# Patient Record
Sex: Female | Born: 1995
Health system: Southern US, Community
[De-identification: ages and names within clinical notes are randomized; demographics above are authoritative.]

## PROBLEM LIST (undated history)

## (undated) DIAGNOSIS — E119 Type 2 diabetes mellitus without complications: Secondary | ICD-10-CM

## (undated) DIAGNOSIS — J45909 Unspecified asthma, uncomplicated: Secondary | ICD-10-CM

## (undated) DIAGNOSIS — M1990 Unspecified osteoarthritis, unspecified site: Secondary | ICD-10-CM

## (undated) HISTORY — PX: WRIST SURGERY: SHX841

## (undated) HISTORY — DX: Unspecified osteoarthritis, unspecified site: M19.90

---
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
## 2014-02-05 ENCOUNTER — Encounter (HOSPITAL_COMMUNITY): Payer: Self-pay | Admitting: Obstetrics and Gynecology

## 2014-04-12 ENCOUNTER — Ambulatory Visit: Payer: Medicaid Other | Admitting: Family

## 2014-05-10 ENCOUNTER — Ambulatory Visit: Payer: Medicaid Other | Admitting: Family Medicine

## 2014-06-13 ENCOUNTER — Ambulatory Visit: Payer: Medicaid Other | Admitting: Obstetrics & Gynecology

## 2015-06-28 ENCOUNTER — Encounter (HOSPITAL_BASED_OUTPATIENT_CLINIC_OR_DEPARTMENT_OTHER): Payer: Self-pay | Admitting: *Deleted

## 2015-06-28 ENCOUNTER — Emergency Department (HOSPITAL_BASED_OUTPATIENT_CLINIC_OR_DEPARTMENT_OTHER)
Admission: EM | Admit: 2015-06-28 | Discharge: 2015-06-28 | Disposition: A | Discharge status: Home / Self Care | Payer: Medicaid Other | Attending: Emergency Medicine | Admitting: Emergency Medicine

## 2015-06-28 DIAGNOSIS — Z8739 Personal history of other diseases of the musculoskeletal system and connective tissue: Secondary | ICD-10-CM | POA: Diagnosis not present

## 2015-06-28 DIAGNOSIS — R630 Anorexia: Secondary | ICD-10-CM | POA: Diagnosis not present

## 2015-06-28 DIAGNOSIS — E119 Type 2 diabetes mellitus without complications: Secondary | ICD-10-CM | POA: Insufficient documentation

## 2015-06-28 DIAGNOSIS — R109 Unspecified abdominal pain: Secondary | ICD-10-CM | POA: Insufficient documentation

## 2015-06-28 DIAGNOSIS — J3489 Other specified disorders of nose and nasal sinuses: Secondary | ICD-10-CM | POA: Diagnosis not present

## 2015-06-28 DIAGNOSIS — M545 Low back pain: Secondary | ICD-10-CM | POA: Diagnosis not present

## 2015-06-28 DIAGNOSIS — R000 Tachycardia, unspecified: Secondary | ICD-10-CM | POA: Insufficient documentation

## 2015-06-28 DIAGNOSIS — J45909 Unspecified asthma, uncomplicated: Secondary | ICD-10-CM | POA: Diagnosis not present

## 2015-06-28 DIAGNOSIS — R509 Fever, unspecified: Secondary | ICD-10-CM | POA: Insufficient documentation

## 2015-06-28 DIAGNOSIS — R51 Headache: Secondary | ICD-10-CM | POA: Insufficient documentation

## 2015-06-28 DIAGNOSIS — R0989 Other specified symptoms and signs involving the circulatory and respiratory systems: Secondary | ICD-10-CM | POA: Insufficient documentation

## 2015-06-28 DIAGNOSIS — R0981 Nasal congestion: Secondary | ICD-10-CM | POA: Insufficient documentation

## 2015-06-28 DIAGNOSIS — R6889 Other general symptoms and signs: Secondary | ICD-10-CM

## 2015-06-28 HISTORY — DX: Type 2 diabetes mellitus without complications: E11.9

## 2015-06-28 LAB — URINALYSIS, ROUTINE W REFLEX MICROSCOPIC
BILIRUBIN URINE: NEGATIVE
Glucose, UA: NEGATIVE mg/dL
Hgb urine dipstick: NEGATIVE
KETONES UR: 15 mg/dL — AB
Leukocytes, UA: NEGATIVE
NITRITE: NEGATIVE
PH: 6 (ref 5.0–8.0)
Protein, ur: NEGATIVE mg/dL
Specific Gravity, Urine: 1.031 — ABNORMAL HIGH (ref 1.005–1.030)

## 2015-06-28 MED ORDER — GUAIFENESIN 100 MG/5ML PO LIQD: 100.0000 mg | ORAL | Status: AC | PRN

## 2015-06-28 MED ORDER — IBUPROFEN 800 MG PO TABS
800.0000 mg | ORAL_TABLET | Freq: Once | ORAL | Status: AC
  Administered 2015-06-28: 800 mg via ORAL
  Filled 2015-06-28: qty 1

## 2015-06-28 MED ORDER — ACETAMINOPHEN 325 MG PO TABS
650.0000 mg | ORAL_TABLET | Freq: Once | ORAL | Status: AC | PRN
  Administered 2015-06-28: 650 mg via ORAL
  Filled 2015-06-28: qty 2

## 2015-06-28 MED ORDER — PROMETHAZINE-DM 6.25-15 MG/5ML PO SYRP: 5.0000 mL | ORAL_SOLUTION | Freq: Four times a day (QID) | ORAL | Status: AC | PRN

## 2015-06-28 NOTE — ED Provider Notes (Signed)
CSN: [id number]     Arrival date & time 06/28/15  1230 History   First MD Initiated Contact with Patient 06/28/15 1316     Chief Complaint  Patient presents with  . Fever     (Consider location/radiation/quality/duration/timing/severity/associated sxs/prior Treatment) HPI   20 year old female with history of non-insulin-dependent diabetes, asthma and rheumatoid arthritis presenting with complaints of flulike symptoms. Patient report for the past 2 days she has had headache, nasal congestion, runny nose, occasional nonproductive cough, body aches, back pain, abdominal pain, and decrease in appetite. Her kids has been sick as well. She has not had any specific treatment tried. She is a nonsmoker, no history of asthma, no prior history of PE or DVT. She denies vision changes, confusion, shortness of breath, hemoptysis, dysuria, hematuria, vaginal bleeding, or vaginal discharge.  Past Medical History  Diagnosis Date  . Asthma     no inhaler  . 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 Diabetes mellitus without complication [facility])    Past Surgical History  Procedure Laterality Date  . Wrist surgery      left  . Ces[facility] section  03/2011    [facility]  . Addenoectomy  2008  . Cesarean section N/A 10/17/2013    Procedure: CESAREAN SECTION;  Surgeon: Catalina Antigua, MD;  Location: [facility];  Service: Obstetrics;  Laterality: N/A;   Family History  Problem Relation Age of Onset  . Arthritis Mother   . Asthma Father   . Asthma Sister   . Asthma Brother   . Cancer Maternal Aunt   . Hypertension Maternal Aunt   . Hyperlipidemia Maternal Aunt   . Hyperlipidemia Maternal Grandmother   . Hypertension Maternal Grandmother   . Heart disease Maternal Grandmother   . Asthma Paternal Grandmother   . Diabetes Paternal Grandmother    Social History  Substance Use Topics  . Smoking status: Never Smoker   . Smokeless tobacco: Never Used  . Alcohol Use: No   OB History    Gravida  Para Term Preterm AB TAB SAB Ectopic Multiple Living   2 2 2       2      Review of Systems  All other systems reviewed and are negative.     Allergies  Review of patient's allergies indicates no known allergies.  Home Medications   Prior to Admission medications   Not on File   BP 107/59 mmHg  Pulse 132  Temp(Src) 101.3 F (38.5 C) (Oral)  Resp 22  Ht 5\' 9"  (1.753 m)  SpO2 100% Physical Exam  Constitutional: She is oriented to person, place, and time. She appears well-developed and well-nourished. No distress.  Well appearing African-American female in no acute discomfort and nontoxic.  HENT:  Head: Atraumatic.  Right Ear: External ear normal.  Left Ear: External ear normal.  Ears: No TMs bilaterally Nose: Rhinorrhea Throat: Uvula is midline no tonsillar enlargement or exudates, no trismus  Eyes: Conjunctivae are normal.  Neck: Normal range of motion. Neck supple.  No nuchal rigidity  Cardiovascular:  Tachycardia without murmurs rubs or gallops  Pulmonary/Chest: Effort normal and breath sounds normal. No respiratory distress. She has no wheezes. She has no rales. She exhibits no tenderness.  Abdominal: Soft. There is no tenderness.  Neurological: She is alert and oriented to person, place, and time.  Skin: No rash noted.  Psychiatric: She has a normal mood and affect.  Nursing note and vitals reviewed.   ED Course  Procedures (  including critical care time) Labs Review Labs Reviewed  URINALYSIS, ROUTINE W REFLEX MICROSCOPIC (NOT AT [facility]) - Abnormal; Notable for the following:    Specific Gravity, Urine 1.031 (*)    Ketones, ur 15 (*)    All other components within normal limits    Imaging Review No results found. I have personally reviewed and evaluated these images and lab results as part of my medical decision-making.   EKG Interpretation None      MDM   Final diagnoses:  Flu-like symptoms    BP 107/59 mmHg  Pulse 126  Temp(Src) 102.9 F  (39.4 C) (Oral)  Resp 22  Ht 5\' 9"  (1.753 m)  SpO2 100%   1:30 PM Patient presents with flulike symptoms. She has no nuchal rigidity concerning for meningitis. Her lungs are clear to auscultation and no hypoxia to suggest pneumonia. She is well-appearing. She does have a temperature of 101.3, Tylenol given. She is able to tolerates by mouth, no signs of dehydration.  2:13 PM Patient has attempted of 102.9. Tylenol and ibuprofen was given. Patient request to leave at this time. Return precaution discussed.  , PA-C 06/28/15 1414  06/30/15, MD 06/28/15 1504

## 2015-06-28 NOTE — ED Notes (Signed)
PA at bedside.

## 2015-06-28 NOTE — ED Notes (Signed)
Per pt report fever with lower back pain with weakness and nausea and body aches, started this morning.

## 2015-06-28 NOTE — Discharge Instructions (Signed)
Viral Infections °A viral infection can be caused by different types of viruses. Most viral infections are not serious and resolve on their own. However, some infections may cause severe symptoms and may lead to further complications. °SYMPTOMS °Viruses can frequently cause: °· Minor sore throat. °· Aches and pains. °· Headaches. °· Runny nose. °· Different types of rashes. °· Watery eyes. °· Tiredness. °· Cough. °· Loss of appetite. °· Gastrointestinal infections, resulting in nausea, vomiting, and diarrhea. °These symptoms do not respond to antibiotics because the infection is not caused by bacteria. However, you might catch a bacterial infection following the viral infection. This is sometimes called a "superinfection." Symptoms of such a bacterial infection may include: °· Worsening sore throat with pus and difficulty swallowing. °· Swollen neck glands. °· Chills and a high or persistent fever. °· Severe headache. °· Tenderness over the sinuses. °· Persistent overall ill feeling (malaise), muscle aches, and tiredness (fatigue). °· Persistent cough. °· Yellow, green, or brown mucus production with coughing. °HOME CARE INSTRUCTIONS  °· Only take over-the-counter or prescription medicines for pain, discomfort, diarrhea, or fever as directed by your caregiver. °· Drink enough water and fluids to keep your urine clear or pale yellow. Sports drinks can provide valuable electrolytes, sugars, and hydration. °· Get plenty of rest and maintain proper nutrition. Soups and broths with crackers or rice are fine. °SEEK IMMEDIATE MEDICAL CARE IF:  °· You have severe headaches, shortness of breath, chest pain, neck pain, or an unusual rash. °· You have uncontrolled vomiting, diarrhea, or you are unable to keep down fluids. °· You or your child has an oral temperature above 102° F (38.9° C), not controlled by medicine. °· Your baby is older than 3 months with a rectal temperature of 102° F (38.9° C) or higher. °· Your baby is 3  months old or younger with a rectal temperature of 100.4° F (38° C) or higher. °MAKE SURE YOU:  °· Understand these instructions. °· Will watch your condition. °· Will get help right away if you are not doing well or get worse. °  °This information is not intended to replace advice given to you by your health care provider. Make sure you discuss any questions you have with your health care provider. °  °Document Released: 12/31/2004 Document Revised: 06/15/2011 Document Reviewed: 08/29/2014 °Elsevier Interactive Patient Education ©2016 Elsevier Inc. ° °

## 2015-06-28 NOTE — ED Notes (Addendum)
Patient c/o low back pain, weakness, generalized body aches, headache, sore throat, and fever for past 2-3 days. Patient denies SOB. Patient c/o abd pain with urination, denies dysuria. Patient reports her children have recently been congested.   Patient did not receive a flu shot this year.  No redness to throat.

## 2017-07-13 ENCOUNTER — Other Ambulatory Visit: Payer: Self-pay

## 2017-07-13 ENCOUNTER — Emergency Department (HOSPITAL_BASED_OUTPATIENT_CLINIC_OR_DEPARTMENT_OTHER)
Admission: EM | Admit: 2017-07-13 | Discharge: 2017-07-13 | Disposition: A | Discharge status: Home / Self Care | Payer: Self-pay | Attending: Physician Assistant | Admitting: Physician Assistant

## 2017-07-13 ENCOUNTER — Encounter (HOSPITAL_BASED_OUTPATIENT_CLINIC_OR_DEPARTMENT_OTHER): Payer: Self-pay | Admitting: *Deleted

## 2017-07-13 DIAGNOSIS — E119 Type 2 diabetes mellitus without complications: Secondary | ICD-10-CM | POA: Insufficient documentation

## 2017-07-13 DIAGNOSIS — J45909 Unspecified asthma, uncomplicated: Secondary | ICD-10-CM | POA: Insufficient documentation

## 2017-07-13 DIAGNOSIS — N3001 Acute cystitis with hematuria: Secondary | ICD-10-CM | POA: Insufficient documentation

## 2017-07-13 DIAGNOSIS — Z79899 Other long term (current) drug therapy: Secondary | ICD-10-CM | POA: Insufficient documentation

## 2017-07-13 LAB — URINALYSIS, ROUTINE W REFLEX MICROSCOPIC
Bilirubin Urine: NEGATIVE
GLUCOSE, UA: NEGATIVE mg/dL
Ketones, ur: 15 mg/dL — AB
Nitrite: NEGATIVE
Protein, ur: 100 mg/dL — AB
SPECIFIC GRAVITY, URINE: 1.025 (ref 1.005–1.030)
pH: 6 (ref 5.0–8.0)

## 2017-07-13 LAB — PREGNANCY, URINE: Preg Test, Ur: NEGATIVE

## 2017-07-13 LAB — URINALYSIS, MICROSCOPIC (REFLEX)

## 2017-07-13 MED ORDER — ACETAMINOPHEN 325 MG PO TABS
650.0000 mg | ORAL_TABLET | Freq: Once | ORAL | Status: AC
  Administered 2017-07-13: 650 mg via ORAL
  Filled 2017-07-13: qty 2

## 2017-07-13 MED ORDER — CEPHALEXIN 500 MG PO CAPS: 500.0000 mg | ORAL_CAPSULE | Freq: Two times a day (BID) | ORAL | 0 refills | Status: AC

## 2017-07-13 MED ORDER — PHENAZOPYRIDINE HCL 200 MG PO TABS: 200.0000 mg | ORAL_TABLET | Freq: Three times a day (TID) | ORAL | 0 refills | Status: AC

## 2017-07-13 MED FILL — CEPHALEXIN 500 MG CAPSULE: 500 | 7 days supply | Qty: 14 | Fill #0

## 2017-07-13 NOTE — ED Notes (Signed)
Provided patient with a gown to put on and patient refused to change into a gown stating she didn't need to she was here for urine check.

## 2017-07-13 NOTE — ED Triage Notes (Signed)
Dysuria x 1 week. Urinary frequency.

## 2017-07-13 NOTE — ED Notes (Signed)
Teaching done with Pt. On taking antibiotics

## 2017-07-13 NOTE — Discharge Instructions (Signed)
Take antibiotics as directed. Please take all of your antibiotics until finished.  You can take Tylenol or Ibuprofen as directed for pain. You can alternate Tylenol and Ibuprofen every 4 hours. If you take Tylenol at 1pm, then you can take Ibuprofen at 5pm. Then you can take Tylenol again at 9pm.   You can [facility].  Return to the Emergency Department for any worsening pain with urination, blood in your urine, fever, persistent vomiting, abdominal pain or any other worsening or concerning symptoms.

## 2017-07-13 NOTE — ED Provider Notes (Addendum)
[facility] EMERGENCY DEPARTMENT Provider Note   CSN: [id number] Arrival date & time: 07/13/17  1457     History   Chief Complaint Chief Complaint  Patient presents with  . Dysuria    HPI Courtney Arroyo is a 22 y.o. female who presents for evaluation of dysuria, increased urinary frequency that has been ongoing for last week.  Patient states she has a history of UTIs and this feels similar to previous UTIs.  Patient reports she has not taken any medications for the pain.  Patient states that she has not had any difficulty eating or drinking since onset of symptoms.  Patient reports she is currently sexually active and does not use protection.  Patient denies any fevers, hematuria, vaginal discharge, flank pain.  The history is provided by the patient.    Past Medical History:  Diagnosis Date  . 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 Asthma    no inhaler  . Diabetes mellitus without complication [facility])     Patient Active Problem List   Diagnosis Date Noted  . S/P C-section 10/17/2013  . Anemia 07/12/2013  . Asthma 07/10/2013  . Supervision of low-risk teen pregnancy 07/10/2013  . Hx of cesarean section 07/10/2013  . 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 Addenoectomy  2008  . CESAREAN SECTION  03/2011   [facility]  . CESAREAN SECTION N/A 10/17/2013   Procedure: CESAREAN SECTION;  Surgeon: Catalina Antigua, MD;  Location: [facility] ORS;  Service: Obstetrics;  Laterality: N/A;  . WRIST SURGERY     left     OB History    Gravida  2   Para  2   Term  2   Preterm      AB      Living  2     SAB      TAB      Ectopic      Multiple      Live Births  2            Home Medications    Prior to Admission medications   Medication Sig Start Date End Date Taking? Authorizing Provider  cephALEXin (KEFLEX) 500 MG capsule Take 1 capsule (500 mg total) by mouth 2 (two) times daily for 7 days.  07/13/17 07/20/17  Maxwell Caul, PA-C  guaiFENesin (ROBITUSSIN) 100 MG/5ML liquid Take 5-10 mLs (100-200 mg total) by mouth every 4 (four) hours as needed for congestion. 06/28/15   Fayrene Helper, PA-C  phenazopyridine (PYRIDIUM) 200 MG tablet Take 1 tablet (200 mg total) by mouth 3 (three) times daily. 07/13/17   Maxwell Caul, PA-C  promethazine-dextromethorphan (PROMETHAZINE-DM) 6.25-15 MG/5ML syrup Take 5 mLs by mouth 4 (four) times daily as needed. 06/28/15   Fayrene Helper, PA-C    Family History Family History  Problem Relation Age of Onset  . Arthritis Mother   . Asthma Father   . Asthma Sister   . Asthma Brother   . Cancer Maternal Aunt   . Hypertension Maternal Aunt   . Hyperlipidemia Maternal Aunt   . Hyperlipidemia Maternal Grandmother   . Hypertension Maternal Grandmother   . Heart disease Maternal Grandmother   . Asthma Paternal Grandmother   . Diabetes Paternal Grandmother     Social History Social History   Tobacco Use  . Smoking status: Never Smoker  . Smokeless tobacco: Never Used  Substance Use Topics  . Alcohol use: No  .  Drug use: No     Allergies   Patient has no known allergies.   Review of Systems Review of Systems  Constitutional: Negative for fever.  Respiratory: Negative for cough and shortness of breath.   Cardiovascular: Negative for chest pain.  Gastrointestinal: Negative for abdominal pain, nausea and vomiting.  Genitourinary: Positive for dysuria and frequency. Negative for hematuria.  Neurological: Negative for headaches.    Physical Exam Updated Vital Signs BP 127/71   Pulse 99   Temp 98.7 F (37.1 C) (Oral)   Resp 18   Ht 5\' 10"  (1.778 m)   Wt 116.1 kg (256 lb)   LMP 07/05/2017   SpO2 100%   BMI 36.73 kg/m   Physical Exam  Constitutional: She appears well-developed and well-nourished.  HENT:  Head: Normocephalic and atraumatic.  Eyes: Conjunctivae and EOM are normal. Right eye exhibits no discharge. Left eye exhibits  no discharge. No scleral icterus.  Pulmonary/Chest: Effort normal.  Abdominal: Normal appearance and bowel sounds are normal. There is no tenderness. There is no rigidity, no guarding and no CVA tenderness.  Abdomen is soft, non-distended, non-tender. No CVA  Tenderness bilaterally. No rigidity or guarding.   Neurological: She is alert.  Skin: Skin is warm and dry.  Psychiatric: She has a normal mood and affect. Her speech is normal and behavior is normal.  Nursing note and vitals reviewed.  No cvat   ED Treatments / Results  Labs (all labs ordered are listed, but only abnormal results are displayed) Labs Reviewed  URINALYSIS, ROUTINE W REFLEX MICROSCOPIC - Abnormal; Notable for the following components:      Result Value   APPearance CLOUDY (*)    Hgb urine dipstick SMALL (*)    Ketones, ur 15 (*)    Protein, ur 100 (*)    Leukocytes, UA SMALL (*)    All other components within normal limits  URINALYSIS, MICROSCOPIC (REFLEX) - Abnormal; Notable for the following components:   Bacteria, UA RARE (*)    Squamous Epithelial / LPF 6-30 (*)    All other components within normal limits  PREGNANCY, URINE    EKG None  Radiology No results found.  Procedures Procedures (including critical care time)  Medications Ordered in ED Medications  acetaminophen (TYLENOL) tablet 650 mg (650 mg Oral Given 07/13/17 1602)     Initial Impression / Assessment and Plan / ED Course  I have reviewed the triage vital signs and the nursing notes.  Pertinent labs & imaging results that were available during my care of the patient were reviewed by me and considered in my medical decision making (see chart for details).     22 year old female who presents for evaluation of dysuria times 1 week.  Also reports increased urinary frequency.  Reports symptoms feel similar to previous UTIs.  No fevers, nausea vomiting, back pain. Patient is afebrile, non-toxic appearing, sitting comfortably on  examination table. Vital signs reviewed and stable.  On exam, abdomen is soft, nondistended nontender.  No CVA tenderness bilaterally.  Consider UTI.  History/physical exam is not concerning for pyelonephritis, kidney stone.  Initial urine and urine pregnancy ordered at triage.  Urine pregnancy were reviewed.  Negative.  UA shows leukocytes, pyuria.  Given that patient is symptomatic, will plan to treat.  Patient with no known drug allergies.  Will also give Pyridium for symptomatic relief.  Patient instructed follow-up with her primary care doctor for further evaluation. Patient had ample opportunity for questions and discussion. All patient's questions  were answered with full understanding. Strict return precautions discussed. Patient expresses understanding and agreement to plan.   Final Clinical Impressions(s) / ED Diagnoses   Final diagnoses:  Acute cystitis with hematuria    ED Discharge Orders        Ordered    phenazopyridine (PYRIDIUM) 200 MG tablet  3 times daily     07/13/17 1552    cephALEXin (KEFLEX) 500 MG capsule  2 times daily     07/13/17 1552       Maxwell Caul, PA-C 07/14/17 0131    Maxwell Caul, PA-C 07/14/17 0136    Abelino Derrick, MD 07/14/17 2342

## 2017-07-13 NOTE — ED Notes (Signed)
Urine sample sent to lab unable to obtain a culture due to not enough to culture

## 2018-10-20 ENCOUNTER — Emergency Department (HOSPITAL_COMMUNITY): Payer: Medicaid Other

## 2018-10-20 ENCOUNTER — Other Ambulatory Visit: Payer: Self-pay

## 2018-10-20 ENCOUNTER — Encounter (HOSPITAL_COMMUNITY): Payer: Self-pay | Admitting: Emergency Medicine

## 2018-10-20 ENCOUNTER — Emergency Department (HOSPITAL_COMMUNITY)
Admission: EM | Admit: 2018-10-20 | Discharge: 2018-10-20 | Disposition: A | Discharge status: Home / Self Care | Payer: Medicaid Other | Attending: Emergency Medicine | Admitting: Emergency Medicine

## 2018-10-20 DIAGNOSIS — S39012A Strain of muscle, fascia and tendon of lower back, initial encounter: Secondary | ICD-10-CM

## 2018-10-20 DIAGNOSIS — M545 Low back pain: Secondary | ICD-10-CM | POA: Diagnosis present

## 2018-10-20 DIAGNOSIS — E119 Type 2 diabetes mellitus without complications: Secondary | ICD-10-CM | POA: Diagnosis not present

## 2018-10-20 DIAGNOSIS — M069 Rheumatoid arthritis, unspecified: Secondary | ICD-10-CM | POA: Insufficient documentation

## 2018-10-20 LAB — I-STAT BETA HCG BLOOD, ED (MC, WL, AP ONLY): I-stat hCG, quantitative: <5 m[IU]/mL (ref <5)

## 2018-10-20 MED ORDER — METHOCARBAMOL 750 MG PO TABS: 750.0000 mg | ORAL_TABLET | Freq: Three times a day (TID) | ORAL | 0 refills | Status: AC | PRN

## 2018-10-20 MED ORDER — TRAMADOL HCL 50 MG PO TABS
50.0000 mg | ORAL_TABLET | Freq: Once | ORAL | Status: AC
  Administered 2018-10-20: 50 mg via ORAL
  Filled 2018-10-20: qty 1
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
## 2018-10-20 NOTE — Discharge Instructions (Signed)
It was our pleasure to provide your ER care today - we hope that you feel better.  Take ibuprofen or naprosyn as need for pain. You may also take robaxin as need for muscle pain/spasm - no driving for the next 4 hours, or when taking robaxin.  Follow up with primary care doctor in 1 week if symptoms fail to improve/resolve.  Return to ER if worse, new symptoms, new or severe pain, numbness/weakness, other concern.
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
## 2022-04-09 ENCOUNTER — Other Ambulatory Visit: Payer: Self-pay

## 2022-04-09 ENCOUNTER — Emergency Department (HOSPITAL_BASED_OUTPATIENT_CLINIC_OR_DEPARTMENT_OTHER)
Admission: EM | Admit: 2022-04-09 | Discharge: 2022-04-09 | Disposition: A | Discharge status: Home / Self Care | Payer: Medicaid Other | Attending: Emergency Medicine | Admitting: Emergency Medicine

## 2022-04-09 ENCOUNTER — Encounter (HOSPITAL_BASED_OUTPATIENT_CLINIC_OR_DEPARTMENT_OTHER): Payer: Self-pay | Admitting: Urology

## 2022-04-09 DIAGNOSIS — N898 Other specified noninflammatory disorders of vagina: Secondary | ICD-10-CM | POA: Diagnosis present

## 2022-04-09 DIAGNOSIS — B9689 Other specified bacterial agents as the cause of diseases classified elsewhere: Secondary | ICD-10-CM

## 2022-04-09 DIAGNOSIS — N760 Acute vaginitis: Secondary | ICD-10-CM | POA: Insufficient documentation

## 2022-04-09 LAB — URINALYSIS, ROUTINE W REFLEX MICROSCOPIC
Bilirubin Urine: NEGATIVE
Glucose, UA: NEGATIVE mg/dL
Hgb urine dipstick: NEGATIVE
Ketones, ur: NEGATIVE mg/dL
Nitrite: NEGATIVE
Protein, ur: NEGATIVE mg/dL
Specific Gravity, Urine: 1.025 (ref 1.005–1.030)
pH: 7 (ref 5.0–8.0)

## 2022-04-09 LAB — URINALYSIS, MICROSCOPIC (REFLEX): RBC / HPF: NONE SEEN RBC/hpf (ref 0–5)

## 2022-04-09 LAB — WET PREP, GENITAL
Sperm: NONE SEEN
Trich, Wet Prep: NONE SEEN
WBC, Wet Prep HPF POC: >=10 — AB (ref <10)
Yeast Wet Prep HPF POC: NONE SEEN

## 2022-04-09 LAB — PREGNANCY, URINE: Preg Test, Ur: NEGATIVE

## 2022-04-09 MED ORDER — METRONIDAZOLE 500 MG PO TABS: 500.0000 mg | ORAL_TABLET | Freq: Two times a day (BID) | ORAL | 0 refills | Status: AC

## 2022-04-09 NOTE — ED Triage Notes (Signed)
Pt states exposed to STD  Pt states itching and vaginal discharge and odor that started today

## 2022-04-09 NOTE — ED Notes (Signed)
Patient assisted to the bathroom for self swab.

## 2022-04-09 NOTE — ED Provider Notes (Signed)
[facility] EMERGENCY DEPARTMENT Provider Note   CSN: [id number] Arrival date & time: 04/09/22  1856     History  Chief Complaint  Patient presents with   SEXUALLY TRANSMITTED DISEASE    Courtney Arroyo is a 27 y.o. female.  Patient is here with vaginal discharge and itching.  May be exposure to STD.  She has no significant medical problems.  Denies any abdominal pain or pain with urination or vaginal bleeding.  The history is provided by the patient.       Home Medications Prior to Admission medications   Medication Sig Start Date End Date Taking? Authorizing Provider  metroNIDAZOLE (FLAGYL) 500 MG tablet Take 1 tablet (500 mg total) by mouth 2 (two) times daily for 7 days. 04/09/22 04/16/22 Yes Abdullah Rizzi, DO  guaiFENesin (ROBITUSSIN) 100 MG/5ML liquid Take 5-10 mLs (100-200 mg total) by mouth every 4 (four) hours as needed for congestion. 06/28/15   Courtney Moras, PA-C  methocarbamol (ROBAXIN) 750 MG tablet Take 1 tablet (750 mg total) by mouth 3 (three) times daily as needed (muscle spasm/pain). 10/20/18   Lajean Saver, MD  phenazopyridine (PYRIDIUM) 200 MG tablet Take 1 tablet (200 mg total) by mouth 3 (three) times daily. 07/13/17   Courtney Napoleon, PA-C  promethazine-dextromethorphan (PROMETHAZINE-DM) 6.25-15 MG/5ML syrup Take 5 mLs by mouth 4 (four) times daily as needed. 06/28/15   Courtney Moras, PA-C      Allergies    Patient has no known allergies.    Review of Systems   Review of Systems  Physical Exam Updated Vital Signs BP 132/65 (BP Location: Right Arm)   Pulse 89   Temp 98.4 F (36.9 C)   Resp 14   Ht 5\' 10"  (1.778 m)   Wt 116.1 kg   LMP 03/11/2022 (Approximate)   SpO2 100%   BMI 36.73 kg/m  Physical Exam Vitals and nursing note reviewed.  Constitutional:      General: She is not in acute distress.    Appearance: She is well-developed.  HENT:     Head: Normocephalic and atraumatic.  Eyes:     Conjunctiva/sclera: Conjunctivae normal.   Cardiovascular:     Rate and Rhythm: Normal rate and regular rhythm.     Heart sounds: No murmur heard. Pulmonary:     Effort: Pulmonary effort is normal. No respiratory distress.     Breath sounds: Normal breath sounds.  Abdominal:     Palpations: Abdomen is soft.     Tenderness: There is no abdominal tenderness.  Musculoskeletal:        General: No swelling.     Cervical back: Neck supple.  Skin:    General: Skin is warm and dry.     Capillary Refill: Capillary refill takes less than 2 seconds.  Neurological:     Mental Status: She is alert.  Psychiatric:        Mood and Affect: Mood normal.     ED Results / Procedures / Treatments   Labs (all labs ordered are listed, but only abnormal results are displayed) Labs Reviewed  WET PREP, GENITAL - Abnormal; Notable for the following components:      Result Value   Clue Cells Wet Prep HPF POC PRESENT (*)    WBC, Wet Prep HPF POC >=10 (*)    All other components within normal limits  URINALYSIS, ROUTINE W REFLEX MICROSCOPIC - Abnormal; Notable for the following components:   Leukocytes,Ua MODERATE (*)    All other components within  normal limits  URINALYSIS, MICROSCOPIC (REFLEX) - Abnormal; Notable for the following components:   Bacteria, UA RARE (*)    All other components within normal limits  PREGNANCY, URINE  GC/CHLAMYDIA PROBE AMP ([place]) NOT AT [facility]    EKG None  Radiology No results found.  Procedures Procedures    Medications Ordered in ED Medications - No data to display  ED Course/ Medical Decision Making/ A&P                           Medical Decision Making Amount and/or Complexity of Data Reviewed Labs: ordered.  Risk Prescription drug management.   Courtney Arroyo is here for vaginal discharge.  Overall she has no abdominal pain or vaginal bleeding.  She is well-appearing.  Pregnancy test and urinalysis negative for infection and pregnancy.  Recommend this is negative.  Positive for  clue cells.  Will treat for bacterial vaginosis.  Will hold on empiric treatment of STDs as gonorrhea and Chlamydia test is pending.  Patient discharged in good condition.  Understands return precautions.  Have no concern for PID.  This chart was dictated using voice recognition software.  Despite best efforts to proofread,  errors can occur which can change the documentation meaning.         Final Clinical Impression(s) / ED Diagnoses Final diagnoses:  Bacterial vaginosis    Rx / DC Orders ED Discharge Orders          Ordered    metroNIDAZOLE (FLAGYL) 500 MG tablet  2 times daily        04/09/22 2033              Lennice Sites, DO 04/09/22 2034

## 2022-04-10 LAB — GC/CHLAMYDIA PROBE AMP (~~LOC~~) NOT AT ARMC
Chlamydia: NEGATIVE
Comment: NEGATIVE
Comment: NORMAL
Neisseria Gonorrhea: NEGATIVE

## 2022-05-08 ENCOUNTER — Inpatient Hospital Stay: Payer: Medicaid Other | Admitting: Family

## 2022-05-08 ENCOUNTER — Inpatient Hospital Stay: Payer: Medicaid Other | Attending: Hematology & Oncology

## 2022-05-08 ENCOUNTER — Other Ambulatory Visit: Payer: Self-pay | Admitting: Family

## 2022-05-08 DIAGNOSIS — D75839 Thrombocytosis, unspecified: Secondary | ICD-10-CM

## 2022-05-08 DIAGNOSIS — D649 Anemia, unspecified: Secondary | ICD-10-CM

## 2022-12-01 ENCOUNTER — Emergency Department (HOSPITAL_BASED_OUTPATIENT_CLINIC_OR_DEPARTMENT_OTHER)
Admission: EM | Admit: 2022-12-01 | Discharge: 2022-12-01 | Disposition: A | Discharge status: Home / Self Care | Payer: Medicaid Other | Attending: Emergency Medicine | Admitting: Emergency Medicine

## 2022-12-01 ENCOUNTER — Encounter (HOSPITAL_BASED_OUTPATIENT_CLINIC_OR_DEPARTMENT_OTHER): Payer: Self-pay | Admitting: Emergency Medicine

## 2022-12-01 ENCOUNTER — Other Ambulatory Visit: Payer: Self-pay

## 2022-12-01 DIAGNOSIS — U071 COVID-19: Secondary | ICD-10-CM | POA: Insufficient documentation

## 2022-12-01 DIAGNOSIS — E119 Type 2 diabetes mellitus without complications: Secondary | ICD-10-CM | POA: Insufficient documentation

## 2022-12-01 DIAGNOSIS — O98511 Other viral diseases complicating pregnancy, first trimester: Secondary | ICD-10-CM | POA: Diagnosis present

## 2022-12-01 DIAGNOSIS — Z3A01 Less than 8 weeks gestation of pregnancy: Secondary | ICD-10-CM | POA: Diagnosis not present

## 2022-12-01 DIAGNOSIS — O09891 Supervision of other high risk pregnancies, first trimester: Secondary | ICD-10-CM | POA: Diagnosis not present

## 2022-12-01 DIAGNOSIS — J45909 Unspecified asthma, uncomplicated: Secondary | ICD-10-CM | POA: Diagnosis not present

## 2022-12-01 DIAGNOSIS — R1010 Upper abdominal pain, unspecified: Secondary | ICD-10-CM | POA: Diagnosis not present

## 2022-12-01 DIAGNOSIS — R112 Nausea with vomiting, unspecified: Secondary | ICD-10-CM

## 2022-12-01 LAB — URINALYSIS, ROUTINE W REFLEX MICROSCOPIC
Glucose, UA: NEGATIVE mg/dL
Hgb urine dipstick: NEGATIVE
Ketones, ur: 15 mg/dL — AB
Leukocytes,Ua: NEGATIVE
Nitrite: NEGATIVE
Protein, ur: 30 mg/dL — AB
Specific Gravity, Urine: >=1.03 (ref 1.005–1.030)
pH: 6 (ref 5.0–8.0)

## 2022-12-01 LAB — CBC
HCT: 28.6 % — ABNORMAL LOW (ref 36.0–46.0)
Hemoglobin: 8.1 g/dL — ABNORMAL LOW (ref 12.0–15.0)
MCH: 19 pg — ABNORMAL LOW (ref 26.0–34.0)
MCHC: 28.3 g/dL — ABNORMAL LOW (ref 30.0–36.0)
MCV: 67 fL — ABNORMAL LOW (ref 80.0–100.0)
Platelets: 612 10*3/uL — ABNORMAL HIGH (ref 150–400)
RBC: 4.27 MIL/uL (ref 3.87–5.11)
RDW: 19.9 % — ABNORMAL HIGH (ref 11.5–15.5)
WBC: 8.9 10*3/uL (ref 4.0–10.5)
nRBC: 0 % (ref 0.0–0.2)

## 2022-12-01 LAB — URINALYSIS, MICROSCOPIC (REFLEX)

## 2022-12-01 LAB — RESP PANEL BY RT-PCR (RSV, FLU A&B, COVID)  RVPGX2
Influenza A by PCR: NEGATIVE
Influenza B by PCR: NEGATIVE
Resp Syncytial Virus by PCR: NEGATIVE
SARS Coronavirus 2 by RT PCR: POSITIVE — AB

## 2022-12-01 LAB — COMPREHENSIVE METABOLIC PANEL
ALT: 17 U/L (ref 0–44)
AST: 19 U/L (ref 15–41)
Albumin: 3.9 g/dL (ref 3.5–5.0)
Alkaline Phosphatase: 54 U/L (ref 38–126)
Anion gap: 10 (ref 5–15)
BUN: 6 mg/dL (ref 6–20)
CO2: 21 mmol/L — ABNORMAL LOW (ref 22–32)
Calcium: 8.7 mg/dL — ABNORMAL LOW (ref 8.9–10.3)
Chloride: 103 mmol/L (ref 98–111)
Creatinine, Ser: 0.74 mg/dL (ref 0.44–1.00)
GFR, Estimated: >60 mL/min (ref >60)
Glucose, Bld: 100 mg/dL — ABNORMAL HIGH (ref 70–99)
Potassium: 3.5 mmol/L (ref 3.5–5.1)
Sodium: 134 mmol/L — ABNORMAL LOW (ref 135–145)
Total Bilirubin: 0.5 mg/dL (ref 0.3–1.2)
Total Protein: 7.7 g/dL (ref 6.5–8.1)

## 2022-12-01 LAB — LIPASE, BLOOD: Lipase: 26 U/L (ref 11–51)

## 2022-12-01 LAB — GROUP A STREP BY PCR: Group A Strep by PCR: NOT DETECTED

## 2022-12-01 MED ORDER — ONDANSETRON 4 MG PO TBDP: 4.0000 mg | ORAL_TABLET | Freq: Three times a day (TID) | ORAL | 0 refills | Status: DC | PRN

## 2022-12-01 MED ORDER — ALUM & MAG HYDROXIDE-SIMETH 200-200-20 MG/5ML PO SUSP: 15.0000 mL | Freq: Once | ORAL | Status: DC

## 2022-12-01 MED ORDER — SODIUM CHLORIDE 0.9 % IV BOLUS
1000.0000 mL | Freq: Once | INTRAVENOUS | Status: AC
  Administered 2022-12-01: 1000 mL via INTRAVENOUS

## 2022-12-01 MED ORDER — ALBUTEROL SULFATE (2.5 MG/3ML) 0.083% IN NEBU: 2.5000 mg | INHALATION_SOLUTION | Freq: Four times a day (QID) | RESPIRATORY_TRACT | 12 refills | Status: AC | PRN

## 2022-12-01 MED ORDER — FAMOTIDINE 20 MG PO TABS: 20.0000 mg | ORAL_TABLET | Freq: Once | ORAL | Status: DC

## 2022-12-01 MED ORDER — ALBUTEROL SULFATE HFA 108 (90 BASE) MCG/ACT IN AERS
1.0000 | INHALATION_SPRAY | Freq: Four times a day (QID) | RESPIRATORY_TRACT | 0 refills | Status: AC | PRN
Start: 2022-12-01 — End: ?

## 2022-12-01 MED ORDER — ONDANSETRON HCL 4 MG/2ML IJ SOLN
4.0000 mg | Freq: Once | INTRAMUSCULAR | Status: AC
  Administered 2022-12-01: 4 mg via INTRAVENOUS
  Filled 2022-12-01: qty 2

## 2022-12-01 NOTE — ED Triage Notes (Addendum)
Emesis since last night , congestion , sore throat .  Abd pain . [redacted] weeks pregnant

## 2022-12-01 NOTE — Discharge Instructions (Addendum)
As discussed, visit today overall reassuring.  Blood test were without any significant abnormality.  You did test positive for COVID which could be causing your symptoms.  Will send in Zofran to take as needed for nausea/vomiting.  Recommend electrolyte rich fluid supplementation in form of Pedialyte, sugar-free Gatorade, liquid IV.  Recommend bland diet over the next few days until he able to tolerate more complex foods.  Please do not hesitate to return to emergency department if the worrisome signs and symptoms we discussed become apparent.

## 2022-12-01 NOTE — ED Provider Notes (Signed)
[place] [facility] Provider Note   CSN: [id number] Arrival date & time: 12/01/22  1257     History  Chief Complaint  Patient presents with   Emesis    Courtney Arroyo is a 27 y.o. female.   Emesis   27 year old female presents emergency department with complaints of nausea, emesis and pain around her upper middle abdomen.  Patient reports she is [redacted] weeks pregnant with her fifth pregnancy.  Prior 4 were delivered via cesarean section.  States that symptoms began last night.  States she has some accompanied congestion as well as some sore throat.  Denies any known sick contact.  Denies any fever, chills, chest pain, shortness of breath, urinary symptoms, vaginal symptoms, change in bowel habits.  Past medical history significant for diabetes mellitus, asthma, arthritis.  Prior abdominal surgeries include cholecystectomy  Home Medications Prior to Admission medications   Medication Sig Start Date End Date Taking? Authorizing Provider  albuterol (PROVENTIL) (2.5 MG/3ML) 0.083% nebulizer solution Take 3 mLs (2.5 mg total) by nebulization every 6 (six) hours as needed for wheezing or shortness of breath. 12/01/22  Yes Sherian Maroon A, PA  albuterol (VENTOLIN HFA) 108 (90 Base) MCG/ACT inhaler Inhale 1-2 puffs into the lungs every 6 (six) hours as needed for wheezing or shortness of breath. 12/01/22  Yes Sherian Maroon A, PA  ondansetron (ZOFRAN-ODT) 4 MG disintegrating tablet Take 1 tablet (4 mg total) by mouth every 8 (eight) hours as needed for nausea or vomiting. 12/01/22  Yes Sherian Maroon A, PA  guaiFENesin (ROBITUSSIN) 100 MG/5ML liquid Take 5-10 mLs (100-200 mg total) by mouth every 4 (four) hours as needed for congestion. 06/28/15   Fayrene Helper, PA-C  methocarbamol (ROBAXIN) 750 MG tablet Take 1 tablet (750 mg total) by mouth 3 (three) times daily as needed (muscle spasm/pain). 10/20/18   Cathren Laine, MD  phenazopyridine (PYRIDIUM) 200 MG  tablet Take 1 tablet (200 mg total) by mouth 3 (three) times daily. 07/13/17   Maxwell Caul, PA-C  promethazine-dextromethorphan (PROMETHAZINE-DM) 6.25-15 MG/5ML syrup Take 5 mLs by mouth 4 (four) times daily as needed. 06/28/15   Fayrene Helper, PA-C      Allergies    Patient has no known allergies.    Review of Systems   Review of Systems  Gastrointestinal:  Positive for vomiting.  All other systems reviewed and are negative.   Physical Exam Updated Vital Signs BP 110/76 (BP Location: Left Arm)   Pulse 77   Temp 98 F (36.7 C)   Resp 18   Wt 113.4 kg   LMP 03/11/2022 (Approximate)   SpO2 100%   BMI 35.87 kg/m  Physical Exam Vitals and nursing note reviewed.  Constitutional:      General: She is not in acute distress.    Appearance: She is well-developed.  HENT:     Head: Normocephalic and atraumatic.     Nose: Congestion and rhinorrhea present.     Mouth/Throat:     Comments: Mild posterior pharyngeal erythema.  Uvula midline and rises much with phonation.  Tonsils are 1+ bilaterally with no obvious exudate.  No sublingual submandibular swelling.  No trismus.  No changes in phonation per patient. Eyes:     Conjunctiva/sclera: Conjunctivae normal.  Cardiovascular:     Rate and Rhythm: Normal rate and regular rhythm.     Heart sounds: No murmur heard. Pulmonary:     Effort: Pulmonary effort is normal. No respiratory distress.  Breath sounds: Normal breath sounds. No wheezing, rhonchi or rales.  Abdominal:     Palpations: Abdomen is soft.     Tenderness: There is abdominal tenderness in the epigastric area.  Musculoskeletal:        General: No swelling.     Cervical back: Neck supple.  Skin:    General: Skin is warm and dry.     Capillary Refill: Capillary refill takes less than 2 seconds.  Neurological:     Mental Status: She is alert.  Psychiatric:        Mood and Affect: Mood normal.     ED Results / Procedures / Treatments   Labs (all labs ordered are  listed, but only abnormal results are displayed) Labs Reviewed  RESP PANEL BY RT-PCR (RSV, FLU A&B, COVID)  RVPGX2 - Abnormal; Notable for the following components:      Result Value   SARS Coronavirus 2 by RT PCR POSITIVE (*)    All other components within normal limits  COMPREHENSIVE METABOLIC PANEL - Abnormal; Notable for the following components:   Sodium 134 (*)    CO2 21 (*)    Glucose, Bld 100 (*)    Calcium 8.7 (*)    All other components within normal limits  CBC - Abnormal; Notable for the following components:   Hemoglobin 8.1 (*)    HCT 28.6 (*)    MCV 67.0 (*)    MCH 19.0 (*)    MCHC 28.3 (*)    RDW 19.9 (*)    Platelets 612 (*)    All other components within normal limits  URINALYSIS, ROUTINE W REFLEX MICROSCOPIC - Abnormal; Notable for the following components:   APPearance CLOUDY (*)    Bilirubin Urine SMALL (*)    Ketones, ur 15 (*)    Protein, ur 30 (*)    All other components within normal limits  URINALYSIS, MICROSCOPIC (REFLEX) - Abnormal; Notable for the following components:   Bacteria, UA FEW (*)    All other components within normal limits  GROUP A STREP BY PCR  LIPASE, BLOOD    EKG None  Radiology No results found.  Procedures Procedures    Medications Ordered in ED Medications  ondansetron (ZOFRAN) injection 4 mg (4 mg Intravenous Given 12/01/22 1420)  sodium chloride 0.9 % bolus 1,000 mL (0 mLs Intravenous Stopped 12/01/22 1524)    ED Course/ Medical Decision Making/ A&P                                 Medical Decision Making Amount and/or Complexity of Data Reviewed Labs: ordered.  Risk Prescription drug management.   This patient presents to the ED for concern of nausea/emesis/epigastric pain, this involves an extensive number of treatment options, and is a complaint that carries with it a high risk of complications and morbidity.  The differential diagnosis includes gastritis, PUD, viral gastroenteritis, COVID, pancreatitis,  CBD pathology, SBO/LBO, volvulus, diverticulitis, appendicitis   Co morbidities that complicate the patient evaluation  See HPI   Additional history obtained:  Additional history obtained from EMR External records from outside source obtained and reviewed including hospital records   Lab Tests:  I Ordered, and personally interpreted labs.  The pertinent results include:  Results for orders placed or performed during the hospital encounter of 12/01/22 (from the past 24 hour(s))  Resp panel by RT-PCR (RSV, Flu A&B, Covid) Anterior Nasal Swab  Status: Abnormal   Collection Time: 12/01/22  1:25 PM   Specimen: Anterior Nasal Swab  Result Value Ref Range   SARS Coronavirus 2 by RT PCR POSITIVE (A) NEGATIVE   Influenza A by PCR NEGATIVE NEGATIVE   Influenza B by PCR NEGATIVE NEGATIVE   Resp Syncytial Virus by PCR NEGATIVE NEGATIVE  Group A Strep by PCR     Status: None   Collection Time: 12/01/22  1:25 PM   Specimen: Throat; Sterile Swab  Result Value Ref Range   Group A Strep by PCR NOT DETECTED NOT DETECTED  Comprehensive metabolic panel     Status: Abnormal   Collection Time: 12/01/22  2:16 PM  Result Value Ref Range   Sodium 134 (L) 135 - 145 mmol/L   Potassium 3.5 3.5 - 5.1 mmol/L   Chloride 103 98 - 111 mmol/L   CO2 21 (L) 22 - 32 mmol/L   Glucose, Bld 100 (H) 70 - 99 mg/dL   BUN 6 6 - 20 mg/dL   Creatinine, Ser 1.91 0.44 - 1.00 mg/dL   Calcium 8.7 (L) 8.9 - 10.3 mg/dL   Total Protein 7.7 6.5 - 8.1 g/dL   Albumin 3.9 3.5 - 5.0 g/dL   AST 19 15 - 41 U/L   ALT 17 0 - 44 U/L   Alkaline Phosphatase 54 38 - 126 U/L   Total Bilirubin 0.5 0.3 - 1.2 mg/dL   GFR, Estimated >47 >82 mL/min   Anion gap 10 5 - 15  CBC     Status: Abnormal   Collection Time: 12/01/22  2:16 PM  Result Value Ref Range   WBC 8.9 4.0 - 10.5 K/uL   RBC 4.27 3.87 - 5.11 MIL/uL   Hemoglobin 8.1 (L) 12.0 - 15.0 g/dL   HCT 95.6 (L) 21.3 - 08.6 %   MCV 67.0 (L) 80.0 - 100.0 fL   MCH 19.0 (L) 26.0  - 34.0 pg   MCHC 28.3 (L) 30.0 - 36.0 g/dL   RDW 57.8 (H) 46.9 - 62.9 %   Platelets 612 (H) 150 - 400 K/uL   nRBC 0.0 0.0 - 0.2 %  Lipase, blood     Status: None   Collection Time: 12/01/22  2:16 PM  Result Value Ref Range   Lipase 26 11 - 51 U/L  Urinalysis, Routine w reflex microscopic -Urine, Clean Catch     Status: Abnormal   Collection Time: 12/01/22  2:16 PM  Result Value Ref Range   Color, Urine YELLOW YELLOW   APPearance CLOUDY (A) CLEAR   Specific Gravity, Urine >=1.030 1.005 - 1.030   pH 6.0 5.0 - 8.0   Glucose, UA NEGATIVE NEGATIVE mg/dL   Hgb urine dipstick NEGATIVE NEGATIVE   Bilirubin Urine SMALL (A) NEGATIVE   Ketones, ur 15 (A) NEGATIVE mg/dL   Protein, ur 30 (A) NEGATIVE mg/dL   Nitrite NEGATIVE NEGATIVE   Leukocytes,Ua NEGATIVE NEGATIVE  Urinalysis, Microscopic (reflex)     Status: Abnormal   Collection Time: 12/01/22  2:16 PM  Result Value Ref Range   RBC / HPF 0-5 0 - 5 RBC/hpf   WBC, UA 0-5 0 - 5 WBC/hpf   Bacteria, UA FEW (A) NONE SEEN   Squamous Epithelial / HPF 6-10 0 - 5 /HPF   Mucus PRESENT      Imaging Studies ordered:  n/a   Cardiac Monitoring: / EKG:  The patient was maintained on a cardiac monitor.  I personally viewed and interpreted the cardiac monitored which  showed an underlying rhythm of: Sinus rhythm   Consultations Obtained:  n/a   Problem List / ED Course / Critical interventions / Medication management  Nausea, emesis, COVID I ordered medication including 1 L of normal saline, Zofran   Reevaluation of the patient after these medicines showed that the patient improved I have reviewed the patients home medicines and have made adjustments as needed   Social Determinants of Health:  Denies tobacco, illicit drug use   Test / Admission - Considered:  Nausea, emesis, COVID Vitals signs within normal range and stable throughout visit. Laboratory studies significant for: See above 27 year old female presents emergency  department with complaints of nausea, emesis as well as some upper medical abdominal pain associate with bouts of emesis.  Symptoms began yesterday.  Also reported some dry cough as well as some nasal congestion.  On exam, patient with mild epigastric tenderness to palpation that seems to be exacerbated during bouts of emesis and minimally experienced otherwise.  Patient with reassuring workup.  Laboratory studies relatively reassuring.  Low suspicion for pancreatitis given lipase within normal limits.  No transaminitis concerning for hepatitis/CBD pathology.  No elevation alkaline phosphatase renal dysfunction or significant electrolyte derangement.  Suspect patient's symptoms are likely secondary to viral illness with epigastric abdominal pain most likely secondary to gastritis versus esophagitis.  Given reassuring laboratory studies and clinical improvement of symptoms/exam with administration of antiemetic, imaging deemed unnecessary at this time.  Will recommend treatment of symptoms at home with Zofran for nausea/emesis, bland diet and other symptomatic therapy as described in AVS.  Treatment plan discussed at length with patient and she acknowledged understanding was agreeable to said plan.  Patient overall well-appearing, afebrile in no acute distress, tolerating p.o. without difficulty. Worrisome signs and symptoms were discussed with the patient, and the patient acknowledged understanding to return to the ED if noticed. Patient was stable upon discharge.          Final Clinical Impression(s) / ED Diagnoses Final diagnoses:  Nausea and vomiting, unspecified vomiting type  COVID    Rx / DC Orders ED Discharge Orders          Ordered    albuterol (VENTOLIN HFA) 108 (90 Base) MCG/ACT inhaler  Every 6 hours PRN        12/01/22 1555    albuterol (PROVENTIL) (2.5 MG/3ML) 0.083% nebulizer solution  Every 6 hours PRN        12/01/22 1555    ondansetron (ZOFRAN-ODT) 4 MG disintegrating tablet   Every 8 hours PRN        12/01/22 1555    For home use only DME Nebulizer machine        12/01/22 1557              Peter Garter, [place] 12/01/22 1730    Alvira Monday, MD 12/01/22 [phone]

## 2022-12-02 ENCOUNTER — Telehealth: Payer: Self-pay | Admitting: Surgery

## 2022-12-02 NOTE — Telephone Encounter (Signed)
ED RNCM received consult from EDP for a nebulizer machine.  RNCM reviewed chart for orders and insurance. Notified patient who is agreeable referral sent to [employer] health liaison. Patient reports not having the albuterol solution for the machine. Message sent to EDP concerning this and name of patient's pharmacy.

## 2023-01-14 ENCOUNTER — Encounter (HOSPITAL_BASED_OUTPATIENT_CLINIC_OR_DEPARTMENT_OTHER): Payer: Self-pay | Admitting: Emergency Medicine

## 2023-01-14 ENCOUNTER — Other Ambulatory Visit: Payer: Self-pay

## 2023-01-14 ENCOUNTER — Emergency Department (HOSPITAL_BASED_OUTPATIENT_CLINIC_OR_DEPARTMENT_OTHER)
Admission: EM | Admit: 2023-01-14 | Discharge: 2023-01-14 | Discharge status: Against Medical Advice | Payer: Medicaid Other | Attending: Emergency Medicine | Admitting: Emergency Medicine

## 2023-01-14 DIAGNOSIS — R42 Dizziness and giddiness: Secondary | ICD-10-CM | POA: Diagnosis present

## 2023-01-14 DIAGNOSIS — Z5321 Procedure and treatment not carried out due to patient leaving prior to being seen by health care provider: Secondary | ICD-10-CM | POA: Insufficient documentation

## 2023-01-14 LAB — BASIC METABOLIC PANEL
Anion gap: 10 (ref 5–15)
BUN: 6 mg/dL (ref 6–20)
CO2: 21 mmol/L — ABNORMAL LOW (ref 22–32)
Calcium: 8.6 mg/dL — ABNORMAL LOW (ref 8.9–10.3)
Chloride: 105 mmol/L (ref 98–111)
Creatinine, Ser: 0.66 mg/dL (ref 0.44–1.00)
GFR, Estimated: >60 mL/min (ref >60)
Glucose, Bld: 114 mg/dL — ABNORMAL HIGH (ref 70–99)
Potassium: 3.6 mmol/L (ref 3.5–5.1)
Sodium: 136 mmol/L (ref 135–145)

## 2023-01-14 LAB — CBC
HCT: 25.2 % — ABNORMAL LOW (ref 36.0–46.0)
Hemoglobin: 7.2 g/dL — ABNORMAL LOW (ref 12.0–15.0)
MCH: 19.4 pg — ABNORMAL LOW (ref 26.0–34.0)
MCHC: 28.6 g/dL — ABNORMAL LOW (ref 30.0–36.0)
MCV: 67.7 fL — ABNORMAL LOW (ref 80.0–100.0)
Platelets: 552 10*3/uL — ABNORMAL HIGH (ref 150–400)
RBC: 3.72 MIL/uL — ABNORMAL LOW (ref 3.87–5.11)
RDW: 18.6 % — ABNORMAL HIGH (ref 11.5–15.5)
WBC: 7.6 10*3/uL (ref 4.0–10.5)
nRBC: 0 % (ref 0.0–0.2)

## 2023-01-14 LAB — CBG MONITORING, ED: Glucose-Capillary: 128 mg/dL — ABNORMAL HIGH (ref 70–99)

## 2023-01-14 NOTE — ED Notes (Signed)
Patient called in lobby x2 , no answer.

## 2023-01-14 NOTE — ED Notes (Signed)
Patient was called by phone with no answer x2.

## 2023-01-14 NOTE — ED Triage Notes (Signed)
Pt to eR states she was at work when she got dizzy and lightheaded and felt weak and tired.  Pt states she thinks her "blood is low again" "maybe I need another transfusion".  Denies pain, SHOB.

## 2023-09-26 ENCOUNTER — Emergency Department (HOSPITAL_BASED_OUTPATIENT_CLINIC_OR_DEPARTMENT_OTHER): Admission: EM | Admit: 2023-09-26 | Discharge: 2023-09-26 | Disposition: A | Discharge status: Home / Self Care

## 2023-09-26 ENCOUNTER — Other Ambulatory Visit: Payer: Self-pay

## 2023-09-26 ENCOUNTER — Encounter (HOSPITAL_BASED_OUTPATIENT_CLINIC_OR_DEPARTMENT_OTHER): Payer: Self-pay | Admitting: Emergency Medicine

## 2023-09-26 DIAGNOSIS — D72829 Elevated white blood cell count, unspecified: Secondary | ICD-10-CM | POA: Insufficient documentation

## 2023-09-26 DIAGNOSIS — K529 Noninfective gastroenteritis and colitis, unspecified: Secondary | ICD-10-CM | POA: Insufficient documentation

## 2023-09-26 DIAGNOSIS — R1013 Epigastric pain: Secondary | ICD-10-CM | POA: Diagnosis present

## 2023-09-26 LAB — CBC WITH DIFFERENTIAL/PLATELET
Abs Immature Granulocytes: 0.03 10*3/uL (ref 0.00–0.07)
Basophils Absolute: 0 10*3/uL (ref 0.0–0.1)
Basophils Relative: 0 %
Eosinophils Absolute: 0.2 10*3/uL (ref 0.0–0.5)
Eosinophils Relative: 2 %
HCT: 40.7 % (ref 36.0–46.0)
Hemoglobin: 13.4 g/dL (ref 12.0–15.0)
Immature Granulocytes: 0 %
Lymphocytes Relative: 8 %
Lymphs Abs: 0.9 10*3/uL (ref 0.7–4.0)
MCH: 27.7 pg (ref 26.0–34.0)
MCHC: 32.9 g/dL (ref 30.0–36.0)
MCV: 84.3 fL (ref 80.0–100.0)
Monocytes Absolute: 0.9 10*3/uL (ref 0.1–1.0)
Monocytes Relative: 8 %
Neutro Abs: 8.9 10*3/uL — ABNORMAL HIGH (ref 1.7–7.7)
Neutrophils Relative %: 82 %
Platelets: 501 10*3/uL — ABNORMAL HIGH (ref 150–400)
RBC: 4.83 MIL/uL (ref 3.87–5.11)
RDW: 14.2 % (ref 11.5–15.5)
WBC: 10.9 10*3/uL — ABNORMAL HIGH (ref 4.0–10.5)
nRBC: 0 % (ref 0.0–0.2)

## 2023-09-26 LAB — COMPREHENSIVE METABOLIC PANEL WITH GFR
ALT: 11 U/L (ref 0–44)
AST: 15 U/L (ref 15–41)
Albumin: 4.5 g/dL (ref 3.5–5.0)
Alkaline Phosphatase: 67 U/L (ref 38–126)
Anion gap: 11 (ref 5–15)
BUN: 11 mg/dL (ref 6–20)
CO2: 23 mmol/L (ref 22–32)
Calcium: 8.6 mg/dL — ABNORMAL LOW (ref 8.9–10.3)
Chloride: 103 mmol/L (ref 98–111)
Creatinine, Ser: 0.73 mg/dL (ref 0.44–1.00)
GFR, Estimated: >60 mL/min (ref >60)
Glucose, Bld: 100 mg/dL — ABNORMAL HIGH (ref 70–99)
Potassium: 3.8 mmol/L (ref 3.5–5.1)
Sodium: 137 mmol/L (ref 135–145)
Total Bilirubin: 0.7 mg/dL (ref 0.0–1.2)
Total Protein: 7.3 g/dL (ref 6.5–8.1)

## 2023-09-26 LAB — HCG, SERUM, QUALITATIVE: Preg, Serum: NEGATIVE

## 2023-09-26 LAB — LIPASE, BLOOD: Lipase: 16 U/L (ref 11–51)

## 2023-09-26 MED ORDER — KETOROLAC TROMETHAMINE 15 MG/ML IJ SOLN
15.0000 mg | Freq: Once | INTRAMUSCULAR | Status: AC
  Administered 2023-09-26: 15 mg via INTRAVENOUS
  Filled 2023-09-26: qty 1

## 2023-09-26 MED ORDER — ONDANSETRON HCL 4 MG/2ML IJ SOLN
4.0000 mg | Freq: Once | INTRAMUSCULAR | Status: AC
  Administered 2023-09-26: 4 mg via INTRAVENOUS
  Filled 2023-09-26: qty 2

## 2023-09-26 MED ORDER — ONDANSETRON 4 MG PO TBDP: 4.0000 mg | ORAL_TABLET | Freq: Three times a day (TID) | ORAL | 0 refills | Status: AC | PRN

## 2023-09-26 NOTE — ED Provider Notes (Addendum)
 [place] [facility] Provider Note   CSN: [id number] Arrival date & time: 09/26/23  1517     Patient presents with: Emesis   Courtney Arroyo is a 28 y.o. female with no significant past medical history who presents with concern for vomiting several times since yesterday evening. States she was eating seafood including mussels and shrimp, and about 30 minutes after eating started vomiting green-yellow emesis. No bloody emesis. Reports mild epigastric abdominal pain. Denies any diarrhea, fevers, recent travel. Denies any dysuria, hematuria, or increased frequency.     Emesis      Prior to Admission medications   Medication Sig Start Date End Date Taking? Authorizing Provider  ondansetron  (ZOFRAN -ODT) 4 MG disintegrating tablet Take 1 tablet (4 mg total) by mouth every 8 (eight) hours as needed for nausea or vomiting. 09/26/23  Yes Veta Palma, PA-C  albuterol  (PROVENTIL ) (2.5 MG/3ML) 0.083% nebulizer solution Take 3 mLs (2.5 mg total) by nebulization every 6 (six) hours as needed for wheezing or shortness of breath. 12/01/22   Silver Wonda LABOR, PA  albuterol  (VENTOLIN  HFA) 108 (90 Base) MCG/ACT inhaler Inhale 1-2 puffs into the lungs every 6 (six) hours as needed for wheezing or shortness of breath. 12/01/22   Silver Wonda LABOR, PA  guaiFENesin  (ROBITUSSIN) 100 MG/5ML liquid Take 5-10 mLs (100-200 mg total) by mouth every 4 (four) hours as needed for congestion. 06/28/15   Nivia Colon, PA-C  methocarbamol  (ROBAXIN ) 750 MG tablet Take 1 tablet (750 mg total) by mouth 3 (three) times daily as needed (muscle spasm/pain). 10/20/18   Bernard Drivers, MD  phenazopyridine  (PYRIDIUM ) 200 MG tablet Take 1 tablet (200 mg total) by mouth 3 (three) times daily. 07/13/17   Layden, Lindsey A, PA-C  promethazine -dextromethorphan (PROMETHAZINE -DM) 6.25-15 MG/5ML syrup Take 5 mLs by mouth 4 (four) times daily as needed. 06/28/15   Nivia Colon, PA-C    Allergies:  Patient has no known allergies.    Review of Systems  Gastrointestinal:  Positive for vomiting.    Updated Vital Signs BP 123/80   Pulse 78   Temp 98.2 F (36.8 C)   Resp 18   Ht 5' 10 (1.778 m)   Wt 104.3 kg   SpO2 100%   Breastfeeding No   BMI 33.00 kg/m   Physical Exam Vitals and nursing note reviewed.  Constitutional:      General: She is not in acute distress.    Appearance: She is well-developed.  HENT:     Head: Normocephalic and atraumatic.   Eyes:     Conjunctiva/sclera: Conjunctivae normal.    Cardiovascular:     Rate and Rhythm: Normal rate and regular rhythm.     Heart sounds: No murmur heard. Pulmonary:     Effort: Pulmonary effort is normal. No respiratory distress.     Breath sounds: Normal breath sounds.  Abdominal:     Palpations: Abdomen is soft.     Tenderness: There is abdominal tenderness.     Comments: Mild epigastric and LUQ TTP without rebound or guarding No active vomiting upon my exam    Musculoskeletal:        General: No swelling.     Cervical back: Neck supple.   Skin:    General: Skin is warm and dry.     Capillary Refill: Capillary refill takes less than 2 seconds.   Neurological:     Mental Status: She is alert.   Psychiatric:        Mood  and Affect: Mood normal.     (all labs ordered are listed, but only abnormal results are displayed) Labs Reviewed  CBC WITH DIFFERENTIAL/PLATELET - Abnormal; Notable for the following components:      Result Value   WBC 10.9 (*)    Platelets 501 (*)    Neutro Abs 8.9 (*)    All other components within normal limits  COMPREHENSIVE METABOLIC PANEL WITH GFR - Abnormal; Notable for the following components:   Glucose, Bld 100 (*)    Calcium 8.6 (*)    All other components within normal limits  LIPASE, BLOOD  HCG, SERUM, QUALITATIVE    EKG: None  Radiology: No results found.   Procedures   Medications Ordered in the ED  ondansetron  (ZOFRAN ) injection 4 mg (4 mg  Intravenous Given 09/26/23 1627)  ketorolac  (TORADOL ) 15 MG/ML injection 15 mg (15 mg Intravenous Given 09/26/23 1721)                                    Medical Decision Making Amount and/or Complexity of Data Reviewed Labs: ordered.  Risk Prescription drug management.     Differential diagnosis includes but is not limited to Cholelithiasis, cholangitis, choledocholithiasis, peptic ulcer, gastritis, gastroenteritis, appendicitis, IBS, IBD, DKA, nephrolithiasis, UTI, pyelonephritis, pancreatitis, diverticulitis, mesenteric ischemia, abdominal aortic aneurysm, small bowel obstruction, volvulus, pregnancy related concerns in females of childbearing age    ED Course:  Upon initial evaluation, patient is well appearing, no acute distress. Stable vitals. No active vomiting. Mild abdominal TTP in the LUQ and epigastric region without rebound or guarding.  Was given Zofran  upon arrival for nausea.  Labs Ordered: I Ordered, and personally interpreted labs.  The pertinent results include:   CBC with mild leukocytosis at 10.9 CMP without any elevation LFTs or creatinine.  Normal electrolytes aside from mildly low calcium at 8.6. Lipase within normal limits Pregnancy negative   Medications Given: Zofran   Upon re-evaluation, patient eating crackers and drinking water at bedside.  No further episodes of emesis here in the emergency room.  She states she is feeling improved with medications.  Given abdomen without significant tenderness to palpation, no elevation in LFTs, creatinine, or lipase, only mild elevation in white blood cells, I have low concern for other acute intra-abdominal pathology.  Denying any urinary symptoms, no indication for urinalysis at this time.  Symptoms of nausea, vomiting after eating seafood last night seem consistent with gastroenteritis.  Stable and appropriate for discharge home.    Impression: Gastroenteritis  Disposition:  The patient was discharged home  with instructions to use prescribed Zofran  as needed for nausea and vomiting.  Eat bland diet, then advance diet as tolerated.  Drink plenty of fluids including water and electrolyte drinks such as Pedialyte at home. Return precautions given.   This chart was dictated using voice recognition software, Dragon. Despite the best efforts of this provider to proofread and correct errors, errors may still occur which can change documentation meaning.       Final diagnoses:  Gastroenteritis    ED Discharge Orders          Ordered    ondansetron  (ZOFRAN -ODT) 4 MG disintegrating tablet  Every 8 hours PRN        09/26/23 1824               Veta Palma, PA-C 09/26/23 1815    Veta Palma, PA-C 09/26/23 1824  Ula Prentice SAUNDERS, MD 09/26/23 [phone]

## 2023-09-26 NOTE — ED Triage Notes (Signed)
 Pt to ED via [facility], c/o NV since midnight; sts she thinks it might have been seafood she ate

## 2023-09-26 NOTE — Discharge Instructions (Signed)
 It appears that you have food poisoning.  Your kidney, liver, pancreas labs were all normal today.  Your pregnancy test was negative.    You were found to have a slightly low calcium level on your labs today. Please increase your dietary intake of calcium with foods such as dairy products, soy products, broccoli, almonds. Please have your PCP monitor this value.   You have been prescribed Zofran  (ondansetron ) for nausea and vomiting. You may take this every 8 hours as needed for nausea and vomiting. This medication dissolves under the tongue. You do not need to swallow it.  Please continue to keep well-hydrated at home with water and electrolyte drink such as Pedialyte.  Eat a bland diet and then advance your diet as tolerated.  Return to the emergency room if you develop any persistent vomiting not controlled with the Zofran  at home, fevers, worsening abdominal pain, any other new or concerning symptoms.

## 2023-10-03 ENCOUNTER — Encounter (HOSPITAL_BASED_OUTPATIENT_CLINIC_OR_DEPARTMENT_OTHER): Payer: Self-pay

## 2023-10-03 ENCOUNTER — Emergency Department (HOSPITAL_BASED_OUTPATIENT_CLINIC_OR_DEPARTMENT_OTHER)
Admission: EM | Admit: 2023-10-03 | Discharge: 2023-10-04 | Disposition: A | Discharge status: Home / Self Care | Attending: Emergency Medicine | Admitting: Emergency Medicine

## 2023-10-03 ENCOUNTER — Other Ambulatory Visit: Payer: Self-pay

## 2023-10-03 DIAGNOSIS — X58XXXD Exposure to other specified factors, subsequent encounter: Secondary | ICD-10-CM | POA: Diagnosis not present

## 2023-10-03 DIAGNOSIS — M25572 Pain in left ankle and joints of left foot: Secondary | ICD-10-CM | POA: Diagnosis present

## 2023-10-03 DIAGNOSIS — R202 Paresthesia of skin: Secondary | ICD-10-CM | POA: Diagnosis not present

## 2023-10-03 DIAGNOSIS — S82892D Other fracture of left lower leg, subsequent encounter for closed fracture with routine healing: Secondary | ICD-10-CM | POA: Insufficient documentation

## 2023-10-03 DIAGNOSIS — S82892A Other fracture of left lower leg, initial encounter for closed fracture: Secondary | ICD-10-CM

## 2023-10-03 NOTE — ED Triage Notes (Addendum)
 Pt reports increased pain to left ankle where she has a hard cast. Pt reports numbness and tingling to toes. Pt toes are pink and warm with good capillary refill. Pt supposed to have surgery on Thursday.

## 2023-10-03 NOTE — ED Notes (Signed)
 Pt notes she fell a week ago and broke her ankle. Hard cast placed Friday.  She notes yesterday her foot started to feel funny, numbness and tingling.  She can move her toes at this time.  Pt also notes that her ortho told her to come straight to an ED at this time. Scheduled for surgery this Thursday.  Pt has no other complaints at this time.  Vitally stable

## 2023-10-04 MED ORDER — HYDROCODONE-ACETAMINOPHEN 5-325 MG PO TABS
1.0000 | ORAL_TABLET | Freq: Once | ORAL | Status: AC
  Administered 2023-10-04: 1 via ORAL
  Filled 2023-10-04: qty 1

## 2023-10-04 MED ORDER — HYDROCODONE-ACETAMINOPHEN 5-325 MG PO TABS: 1.0000 | ORAL_TABLET | Freq: Four times a day (QID) | ORAL | 0 refills | Status: AC | PRN

## 2023-10-04 NOTE — ED Notes (Signed)
 Pt no longer wanting cast removed.  Pt will follow up with ortho on Monday for surgery Thursday.  Pt was medicated and dr notified

## 2023-10-04 NOTE — ED Provider Notes (Signed)
 [place] [facility]  Provider Note  CSN: [id number] Arrival date & time: 10/03/23 2322  History Chief Complaint  Patient presents with   Ankle Pain    Courtney Arroyo is a 28 y.o. female had a recent fall with L lateral malleolus ankle fracture, seen at [facility] on 6/26 where she was splinted and then at Ortho on 6/27 where she was placed in a cast. She is scheduled for surgery on 7/3. She reports 2 days of increased pain, mostly medial with tingling in her toes. She called the on-call Ortho doctor yesterday and was advised to come to the ED for evaluation but she tried to tough it out until tonight. She arrived by EMS. Reports she is out of pain medication at home.    Home Medications Prior to Admission medications   Medication Sig Start Date End Date Taking? Authorizing Provider  HYDROcodone-acetaminophen  (NORCO/VICODIN) 5-325 MG tablet Take 1 tablet by mouth every 6 (six) hours as needed for severe pain (pain score 7-10). 10/04/23  Yes Roselyn Carlin NOVAK, MD  albuterol  (PROVENTIL ) (2.5 MG/3ML) 0.083% nebulizer solution Take 3 mLs (2.5 mg total) by nebulization every 6 (six) hours as needed for wheezing or shortness of breath. 12/01/22   Silver Wonda LABOR, PA  albuterol  (VENTOLIN  HFA) 108 (90 Base) MCG/ACT inhaler Inhale 1-2 puffs into the lungs every 6 (six) hours as needed for wheezing or shortness of breath. 12/01/22   Silver Wonda LABOR, PA  guaiFENesin  (ROBITUSSIN) 100 MG/5ML liquid Take 5-10 mLs (100-200 mg total) by mouth every 4 (four) hours as needed for congestion. 06/28/15   Nivia Colon, PA-C  methocarbamol  (ROBAXIN ) 750 MG tablet Take 1 tablet (750 mg total) by mouth 3 (three) times daily as needed (muscle spasm/pain). 10/20/18   Bernard Drivers, MD  ondansetron  (ZOFRAN -ODT) 4 MG disintegrating tablet Take 1 tablet (4 mg total) by mouth every 8 (eight) hours as needed for nausea or vomiting. 09/26/23   Veta Palma, PA-C  phenazopyridine   (PYRIDIUM ) 200 MG tablet Take 1 tablet (200 mg total) by mouth 3 (three) times daily. 07/13/17   Layden, Lindsey A, PA-C  promethazine -dextromethorphan (PROMETHAZINE -DM) 6.25-15 MG/5ML syrup Take 5 mLs by mouth 4 (four) times daily as needed. 06/28/15   Nivia Colon, PA-C     Allergies    Patient has no known allergies.   Review of Systems   Review of Systems Please see HPI for pertinent positives and negatives  Physical Exam BP (!) 130/94 (BP Location: Right Arm)   Pulse 77   Temp 98.1 F (36.7 C) (Oral)   Resp 18   Ht 5' 10 (1.778 m)   Wt 106.1 kg   LMP 09/03/2023 (Approximate)   SpO2 100%   BMI 33.58 kg/m   Physical Exam Vitals and nursing note reviewed.  HENT:     Head: Normocephalic.     Nose: Nose normal.   Eyes:     Extraocular Movements: Extraocular movements intact.   Pulmonary:     Effort: Pulmonary effort is normal.   Musculoskeletal:     Cervical back: Neck supple.     Comments: L short leg cast in place, normal perfusion of toes, subjective tingling of toes, no loss of sensation   Skin:    Findings: No rash (on exposed skin).   Neurological:     Mental Status: She is alert and oriented to person, place, and time.   Psychiatric:        Mood and Affect: Mood  normal.     ED Results / Procedures / Treatments   EKG None  Procedures Procedures  Medications Ordered in the ED Medications  HYDROcodone-acetaminophen  (NORCO/VICODIN) 5-325 MG per tablet 1 tablet (1 tablet Oral Given 10/04/23 0024)    Initial Impression and Plan  Patient here with pain and tingling in toes with a cast on her leg. I discussed options including cutting the cast off and replacing it with a splint or leaving the cast on and having her see Ortho clinic in the AM. She is unable to decide which she would like to do and would like a few minutes to think about it. Offered pain medication in the meantime.   ED Course   Clinical Course as of 10/04/23 0029  Mon Oct 04, 2023   0013 Patient has decided she would like the cast cut off and replaced with a splint.  [CS]  0026 Patient has changed her mind and will keep the cast on for tonight and call the Ortho office in the AM. Rx for Norco sent for pain control. RTED for any other concerns.  [CS]    Clinical Course User Index [CS] Roselyn Carlin NOVAK, MD     MDM Rules/Calculators/A&P Medical Decision Making Problems Addressed: Closed fracture of left ankle, initial encounter: acute illness or injury Paresthesia: acute illness or injury  Risk Prescription drug management.     Final Clinical Impression(s) / ED Diagnoses Final diagnoses:  Closed fracture of left ankle, initial encounter  Paresthesia    Rx / DC Orders ED Discharge Orders          Ordered    HYDROcodone-acetaminophen  (NORCO/VICODIN) 5-325 MG tablet  Every 6 hours PRN        10/04/23 0028             Roselyn Carlin NOVAK, MD 10/04/23 [phone]

## 2024-01-16 ENCOUNTER — Encounter (HOSPITAL_BASED_OUTPATIENT_CLINIC_OR_DEPARTMENT_OTHER): Payer: Self-pay | Admitting: Emergency Medicine

## 2024-01-16 ENCOUNTER — Other Ambulatory Visit: Payer: Self-pay

## 2024-01-16 ENCOUNTER — Emergency Department (HOSPITAL_BASED_OUTPATIENT_CLINIC_OR_DEPARTMENT_OTHER)
Admission: EM | Admit: 2024-01-16 | Discharge: 2024-01-16 | Disposition: A | Discharge status: Home / Self Care | Attending: Emergency Medicine | Admitting: Emergency Medicine

## 2024-01-16 DIAGNOSIS — Z4802 Encounter for removal of sutures: Secondary | ICD-10-CM | POA: Diagnosis present

## 2024-01-16 NOTE — ED Provider Notes (Signed)
 [place] [facility] Provider Note   CSN: [id number] Arrival date & time: 01/16/24  1953     Patient presents with: Suture / Staple Removal   Courtney Arroyo is a 28 y.o. female.   28 year old female presents today for concern of suture removal from her left eyebrow that were placed on September 21 at [facility].  Denies any concern for infection.  She brought her daughter in today for evaluation of headache and states that these should probably be removed as well.  The history is provided by the patient. No language interpreter was used.       Prior to Admission medications   Medication Sig Start Date End Date Taking? Authorizing Provider  albuterol  (PROVENTIL ) (2.5 MG/3ML) 0.083% nebulizer solution Take 3 mLs (2.5 mg total) by nebulization every 6 (six) hours as needed for wheezing or shortness of breath. 12/01/22   Silver Wonda LABOR, PA  albuterol  (VENTOLIN  HFA) 108 (90 Base) MCG/ACT inhaler Inhale 1-2 puffs into the lungs every 6 (six) hours as needed for wheezing or shortness of breath. 12/01/22   Silver Wonda LABOR, PA  guaiFENesin  (ROBITUSSIN) 100 MG/5ML liquid Take 5-10 mLs (100-200 mg total) by mouth every 4 (four) hours as needed for congestion. 06/28/15   Nivia Colon, PA-C  HYDROcodone -acetaminophen  (NORCO/VICODIN) 5-325 MG tablet Take 1 tablet by mouth every 6 (six) hours as needed for severe pain (pain score 7-10). 10/04/23   Roselyn Carlin NOVAK, MD  methocarbamol  (ROBAXIN ) 750 MG tablet Take 1 tablet (750 mg total) by mouth 3 (three) times daily as needed (muscle spasm/pain). 10/20/18   Bernard Drivers, MD  ondansetron  (ZOFRAN -ODT) 4 MG disintegrating tablet Take 1 tablet (4 mg total) by mouth every 8 (eight) hours as needed for nausea or vomiting. 09/26/23   Veta Palma, PA-C  phenazopyridine  (PYRIDIUM ) 200 MG tablet Take 1 tablet (200 mg total) by mouth 3 (three) times daily. 07/13/17   Layden, Lindsey A, PA-C   promethazine -dextromethorphan (PROMETHAZINE -DM) 6.25-15 MG/5ML syrup Take 5 mLs by mouth 4 (four) times daily as needed. 06/28/15   Nivia Colon, PA-C    Allergies: Patient has no known allergies.    Review of Systems  Skin:  Positive for wound.  All other systems reviewed and are negative.   Updated Vital Signs BP (!) 116/96   Pulse 86   Temp 98.1 F (36.7 C) (Oral)   Resp 16   Ht 5' 10 (1.778 m)   Wt 114.9 kg   LMP 12/24/2023 (Approximate)   SpO2 97%   BMI 36.34 kg/m   Physical Exam Vitals and nursing note reviewed.  Constitutional:      General: She is not in acute distress.    Appearance: Normal appearance. She is not ill-appearing.  HENT:     Head: Normocephalic and atraumatic.     Nose: Nose normal.  Eyes:     Conjunctiva/sclera: Conjunctivae normal.  Pulmonary:     Effort: Pulmonary effort is normal. No respiratory distress.  Musculoskeletal:        General: No deformity.  Skin:    Findings: No rash.     Comments: Well-healed laceration to left eyebrow noted.  6 stitches in place.  Neurological:     Mental Status: She is alert.     (all labs ordered are listed, but only abnormal results are displayed) Labs Reviewed - No data to display  EKG: None  Radiology: No results found.   Suture Removal  Date/Time: 01/16/2024 10:08 PM  Performed by: Hildegard Loge, PA-C Authorized by: Hildegard Loge, PA-C   Consent:    Consent obtained:  Verbal   Consent given by:  Patient   Risks discussed:  Bleeding, pain and wound separation   Alternatives discussed:  No treatment Universal protocol:    Procedure explained and questions answered to patient or proxy's satisfaction: yes     Relevant documents present and verified: yes     Test results available: yes     Patient identity confirmed:  Verbally with patient Location:    Location:  Head/neck   Head/neck location:  Eyebrow   Eyebrow location:  L eyebrow Procedure details:    Wound appearance:  No signs of  infection, good wound healing and clean   Number of sutures removed:  6 Post-procedure details:    Procedure completion:  Tolerated well, no immediate complications    Medications Ordered in the ED - No data to display                                  Medical Decision Making  28 year old female presents today for suture removal.  She had these placed on September 21 at [facility].  No signs of infection.  6 stitches removed.  Supportive care discussed.  Return precaution discussed.  Patient voices understanding and is in agreement with plan.  Final diagnoses:  Visit for suture removal    ED Discharge Orders     None          Hildegard Loge, PA-C 01/16/24 2317    Elnor Jayson LABOR, DO 01/19/24 [phone]

## 2024-01-16 NOTE — ED Triage Notes (Signed)
 Patient requesting suture removal from left eye brown. Sutures placed on 9/21 at [facility]. Denies complications.

## 2024-01-16 NOTE — Discharge Instructions (Signed)
 You had 6 stitches removed today.  No signs of infection.  Return for any emergent symptoms.
